# Patient Record
Sex: Female | Born: 1970 | Race: White | Hispanic: No | Marital: Married | Smoking: Never smoker
Health system: Southern US, Community
[De-identification: ages and names within clinical notes are randomized; demographics above are authoritative.]

## PROBLEM LIST (undated history)

## (undated) ENCOUNTER — Emergency Department: Disposition: A | Discharge status: Home / Self Care | Payer: BC Managed Care – PPO

## (undated) DIAGNOSIS — T7840XA Allergy, unspecified, initial encounter: Secondary | ICD-10-CM

## (undated) DIAGNOSIS — C4371 Malignant melanoma of right lower limb, including hip: Secondary | ICD-10-CM

## (undated) HISTORY — DX: Malignant melanoma of right lower limb, including hip: C43.71

## (undated) HISTORY — DX: Allergy, unspecified, initial encounter: T78.40XA

---

## 2001-11-19 ENCOUNTER — Inpatient Hospital Stay (HOSPITAL_COMMUNITY): Admission: AD | Admit: 2001-11-19 | Discharge: 2001-11-19 | Payer: Self-pay | Admitting: Obstetrics and Gynecology

## 2001-12-19 ENCOUNTER — Ambulatory Visit (HOSPITAL_COMMUNITY): Admission: RE | Admit: 2001-12-19 | Discharge: 2001-12-19 | Payer: Self-pay | Admitting: Obstetrics and Gynecology

## 2002-03-04 ENCOUNTER — Inpatient Hospital Stay (HOSPITAL_COMMUNITY): Admission: AD | Admit: 2002-03-04 | Discharge: 2002-03-04 | Payer: Self-pay | Admitting: Obstetrics and Gynecology

## 2002-03-06 ENCOUNTER — Inpatient Hospital Stay (HOSPITAL_COMMUNITY): Admission: AD | Admit: 2002-03-06 | Discharge: 2002-03-08 | Payer: Self-pay | Admitting: Obstetrics and Gynecology

## 2002-04-10 ENCOUNTER — Other Ambulatory Visit: Admission: RE | Admit: 2002-04-10 | Discharge: 2002-04-10 | Payer: Self-pay | Admitting: Obstetrics and Gynecology

## 2003-06-03 ENCOUNTER — Other Ambulatory Visit: Admission: RE | Admit: 2003-06-03 | Discharge: 2003-06-03 | Payer: Self-pay | Admitting: Obstetrics and Gynecology

## 2003-08-04 ENCOUNTER — Emergency Department (HOSPITAL_COMMUNITY): Admission: AD | Admit: 2003-08-04 | Discharge: 2003-08-04 | Payer: Self-pay | Admitting: Family Medicine

## 2004-04-23 ENCOUNTER — Other Ambulatory Visit: Admission: RE | Admit: 2004-04-23 | Discharge: 2004-04-23 | Payer: Self-pay | Admitting: Obstetrics and Gynecology

## 2004-09-01 ENCOUNTER — Encounter: Admission: RE | Admit: 2004-09-01 | Discharge: 2004-09-01 | Payer: Self-pay | Admitting: Obstetrics and Gynecology

## 2005-07-06 ENCOUNTER — Other Ambulatory Visit: Admission: RE | Admit: 2005-07-06 | Discharge: 2005-07-06 | Payer: Self-pay | Admitting: Obstetrics and Gynecology

## 2005-12-27 ENCOUNTER — Emergency Department (HOSPITAL_COMMUNITY): Admission: EM | Admit: 2005-12-27 | Discharge: 2005-12-27 | Payer: Self-pay | Admitting: Emergency Medicine

## 2006-11-24 ENCOUNTER — Inpatient Hospital Stay (HOSPITAL_COMMUNITY): Admission: AD | Admit: 2006-11-24 | Discharge: 2006-11-24 | Payer: Self-pay | Admitting: Obstetrics and Gynecology

## 2007-09-19 ENCOUNTER — Inpatient Hospital Stay (HOSPITAL_COMMUNITY): Admission: AD | Admit: 2007-09-19 | Discharge: 2007-09-22 | Payer: Self-pay | Admitting: Obstetrics and Gynecology

## 2007-10-28 ENCOUNTER — Emergency Department (HOSPITAL_COMMUNITY): Admission: EM | Admit: 2007-10-28 | Discharge: 2007-10-28 | Payer: Self-pay | Admitting: Emergency Medicine

## 2010-01-05 ENCOUNTER — Encounter: Admission: RE | Admit: 2010-01-05 | Discharge: 2010-01-05 | Payer: Self-pay | Admitting: Obstetrics and Gynecology

## 2011-02-03 ENCOUNTER — Other Ambulatory Visit: Payer: Self-pay | Admitting: Obstetrics and Gynecology

## 2011-02-03 DIAGNOSIS — Z1231 Encounter for screening mammogram for malignant neoplasm of breast: Secondary | ICD-10-CM

## 2011-03-03 ENCOUNTER — Ambulatory Visit: Payer: Self-pay

## 2011-03-16 LAB — RH IMMUNE GLOB WKUP(>/=20WKS)(NOT WOMEN'S HOSP)

## 2011-03-16 LAB — CBC
HCT: 31.9 — ABNORMAL LOW
HCT: 32.9 — ABNORMAL LOW
Hemoglobin: 11.1 — ABNORMAL LOW
MCV: 94.4
Platelets: 185
Platelets: 199
RDW: 13.5
WBC: 11.2 — ABNORMAL HIGH

## 2011-04-08 LAB — RH IMMUNE GLOBULIN WORKUP (NOT WOMEN'S HOSP): Antibody Screen: NEGATIVE

## 2011-06-28 IMAGING — MG MM DIGITAL DIAGNOSTIC BILAT
5 series · 5 of 5 positions shown · non-contrast
Comparison: 09/01/2004

CLINICAL DATA: The patient states she had a palpable left breast
mass that has resolved

DIGITAL DIAGNOSTIC BILATERAL MAMMOGRAM WITH CAD AND LEFT BREAST
ULTRASOUND:

[R CC]
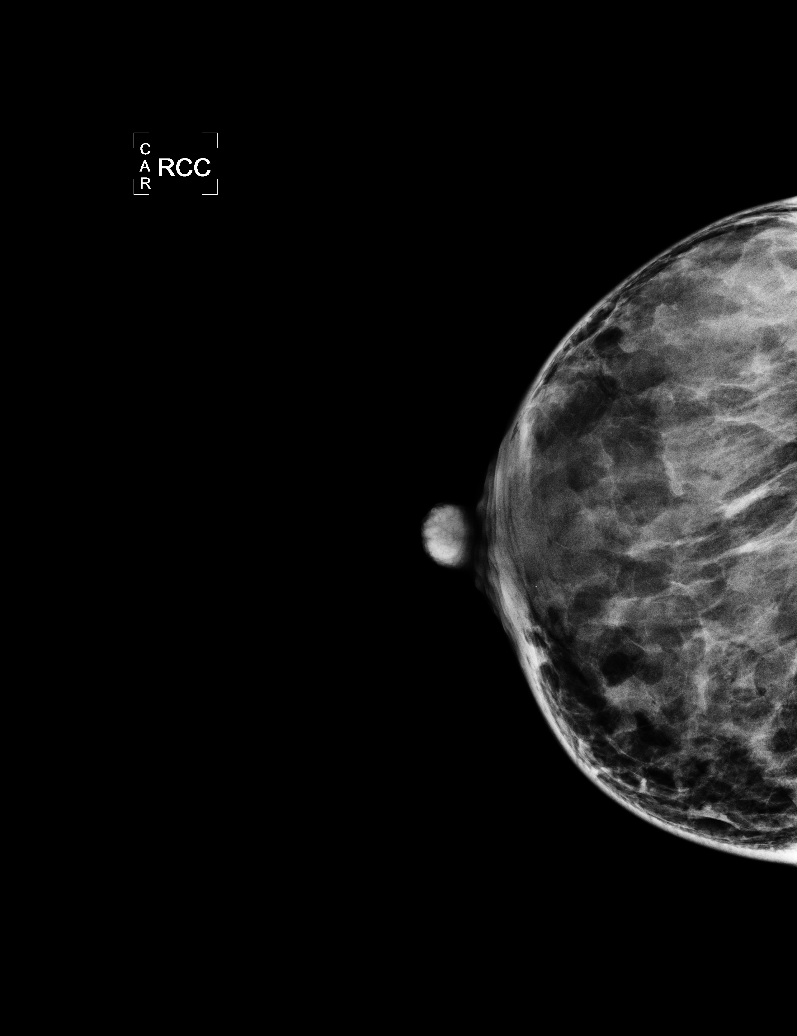

[L CC]
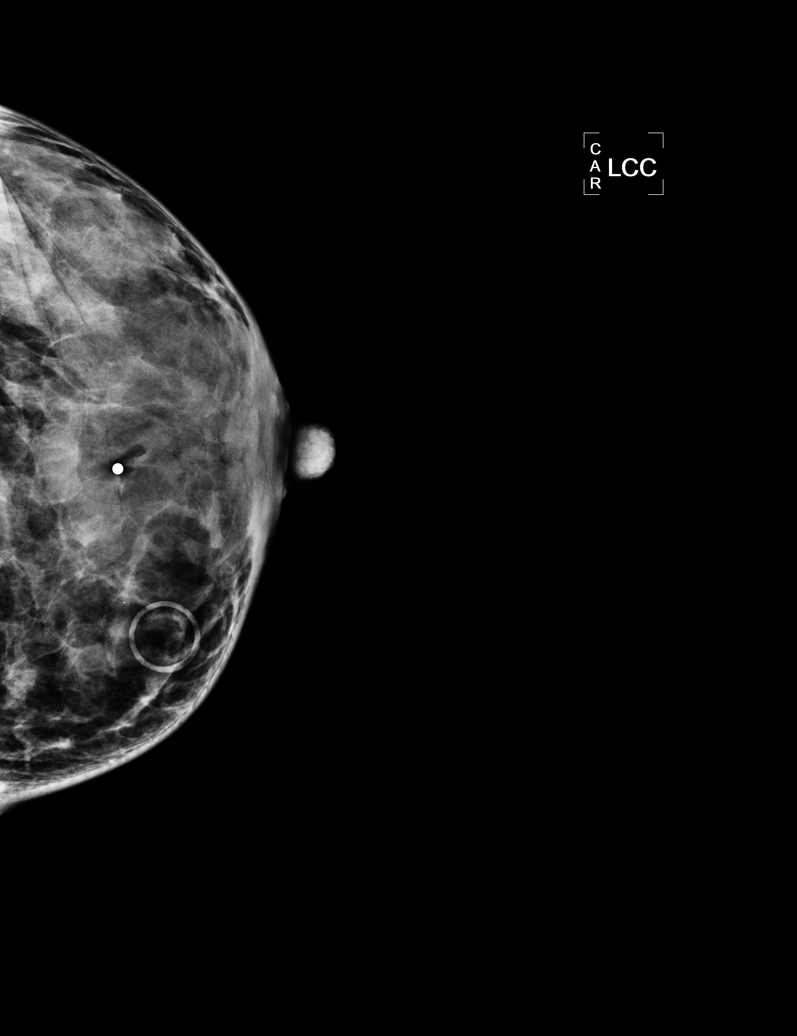

[L MLO]
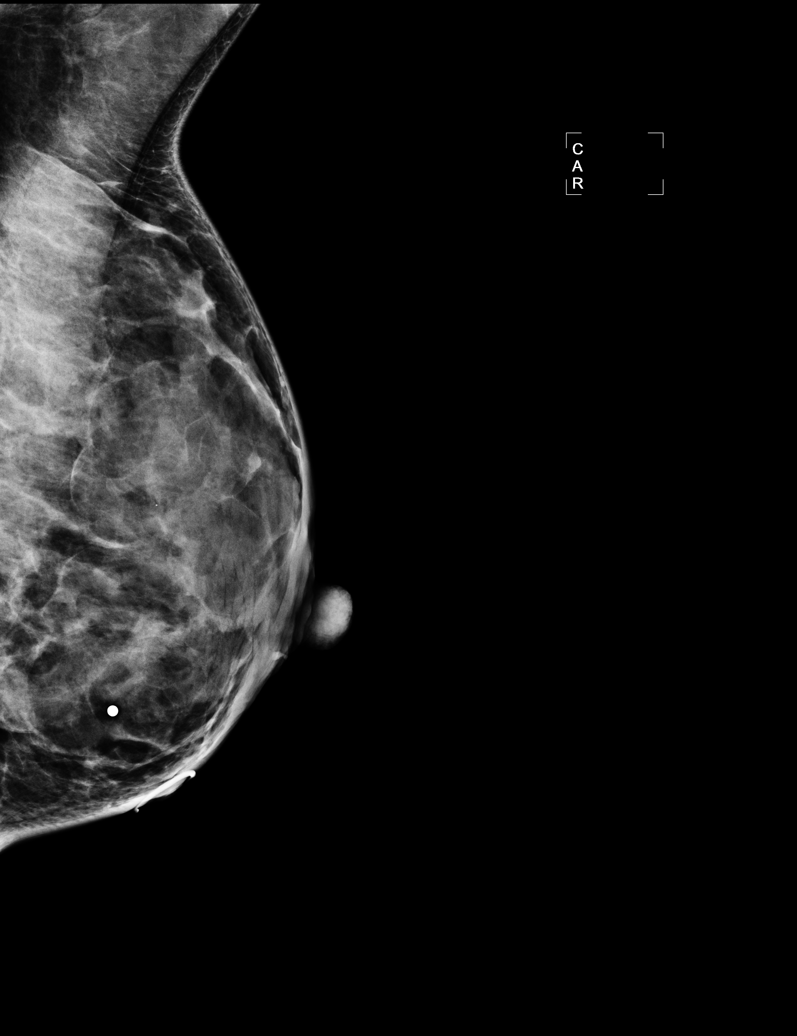

[R MLO]
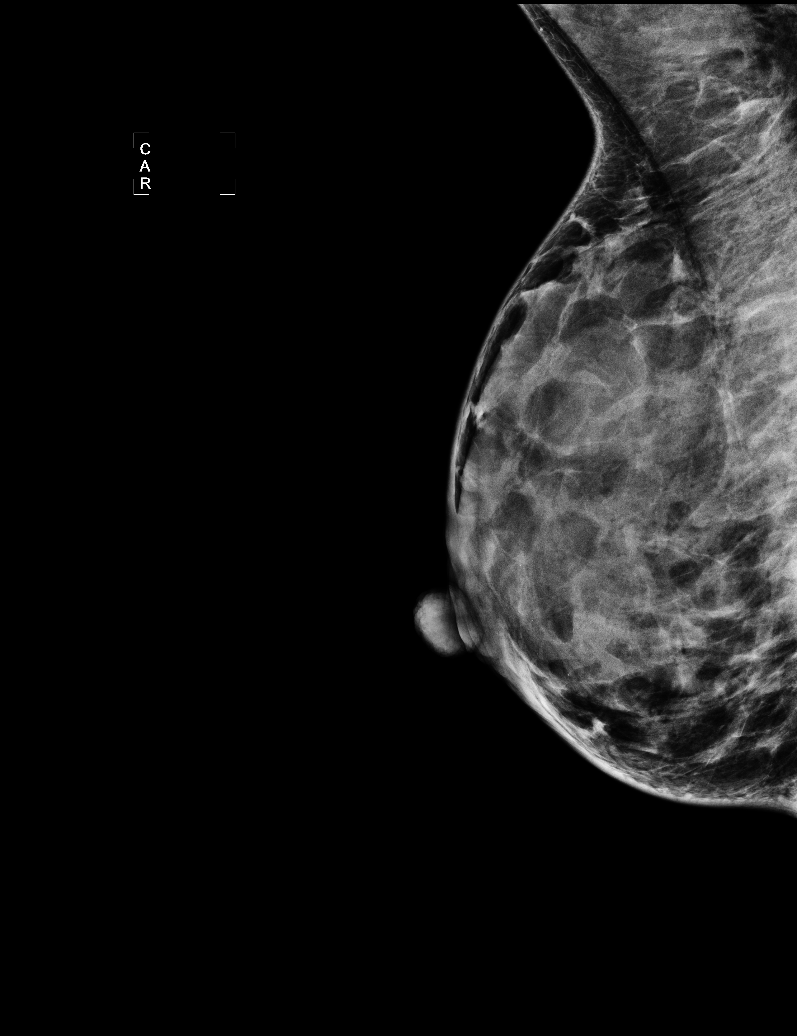

[L TAN]
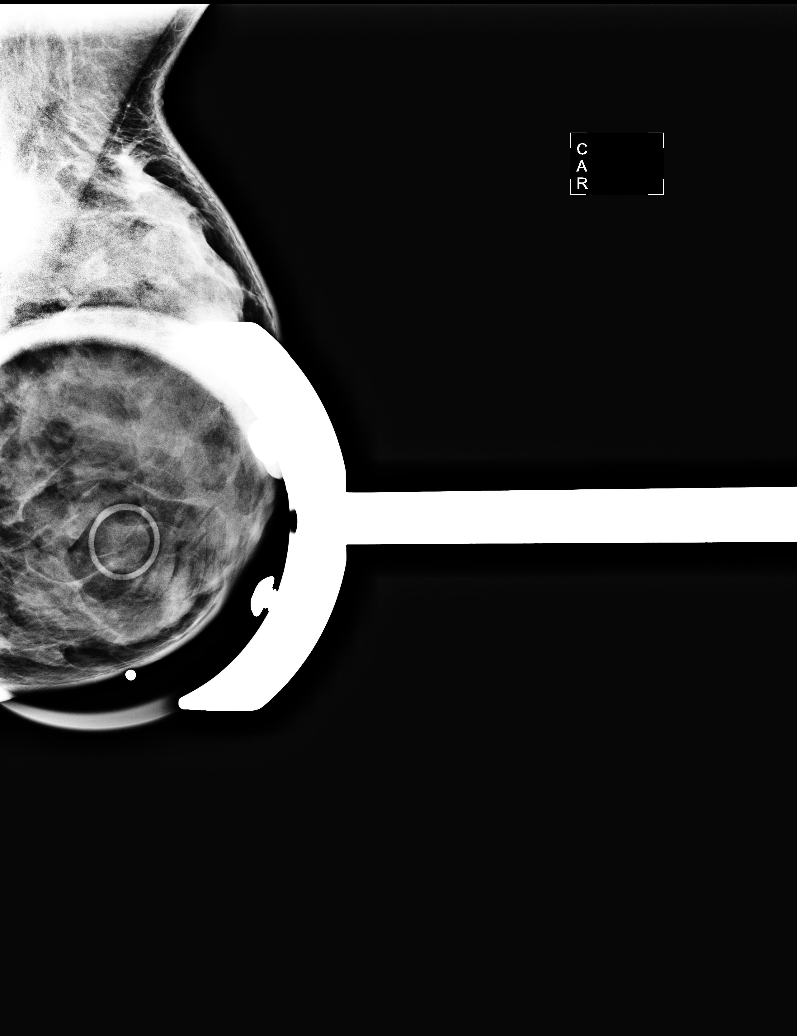

[5 of 5 positions shown; findings below may reference images not displayed]

FINDINGS: There is a dense fibroglandular pattern.  No suspicious
mass or malignant-type microcalcifications identified.
Mammographic images were processed with CAD.

On physical exam, I do not palpate a mass in the left breast.

Ultrasound is performed, showing normal tissue in the inferior
aspect of the left breast.  No solid or cystic mass, abnormal
shadowing or distortion detected.
IMPRESSION: No evidence of malignancy in either breast.  Bilateral screening
mammogram in 1 year is recommended.

BI-RADS CATEGORY 1:  Negative.

## 2011-06-30 ENCOUNTER — Ambulatory Visit
Admission: RE | Admit: 2011-06-30 | Discharge: 2011-06-30 | Disposition: A | Discharge status: Home / Self Care | Payer: Self-pay | Source: Ambulatory Visit | Attending: Obstetrics and Gynecology | Admitting: Obstetrics and Gynecology

## 2011-06-30 DIAGNOSIS — Z1231 Encounter for screening mammogram for malignant neoplasm of breast: Secondary | ICD-10-CM

## 2012-09-07 ENCOUNTER — Other Ambulatory Visit: Payer: Self-pay | Admitting: Obstetrics and Gynecology

## 2012-09-19 ENCOUNTER — Ambulatory Visit
Admission: RE | Admit: 2012-09-19 | Discharge: 2012-09-19 | Disposition: A | Discharge status: Home / Self Care | Payer: BC Managed Care – PPO | Source: Ambulatory Visit | Attending: Obstetrics and Gynecology | Admitting: Obstetrics and Gynecology

## 2012-09-19 DIAGNOSIS — R928 Other abnormal and inconclusive findings on diagnostic imaging of breast: Secondary | ICD-10-CM

## 2014-03-11 IMAGING — MG MM DIAGNOSTIC UNILATERAL L
3 series · 3 of 3 positions shown · non-contrast
Comparison: 09/04/2012 and earlier

CLINICAL DATA: The patient returns after screening study for
evaluation of the left axilla.

DIGITAL DIAGNOSTIC LEFT MAMMOGRAM  AND LEFT BREAST ULTRASOUND:

[L MLO (1 of 2)]
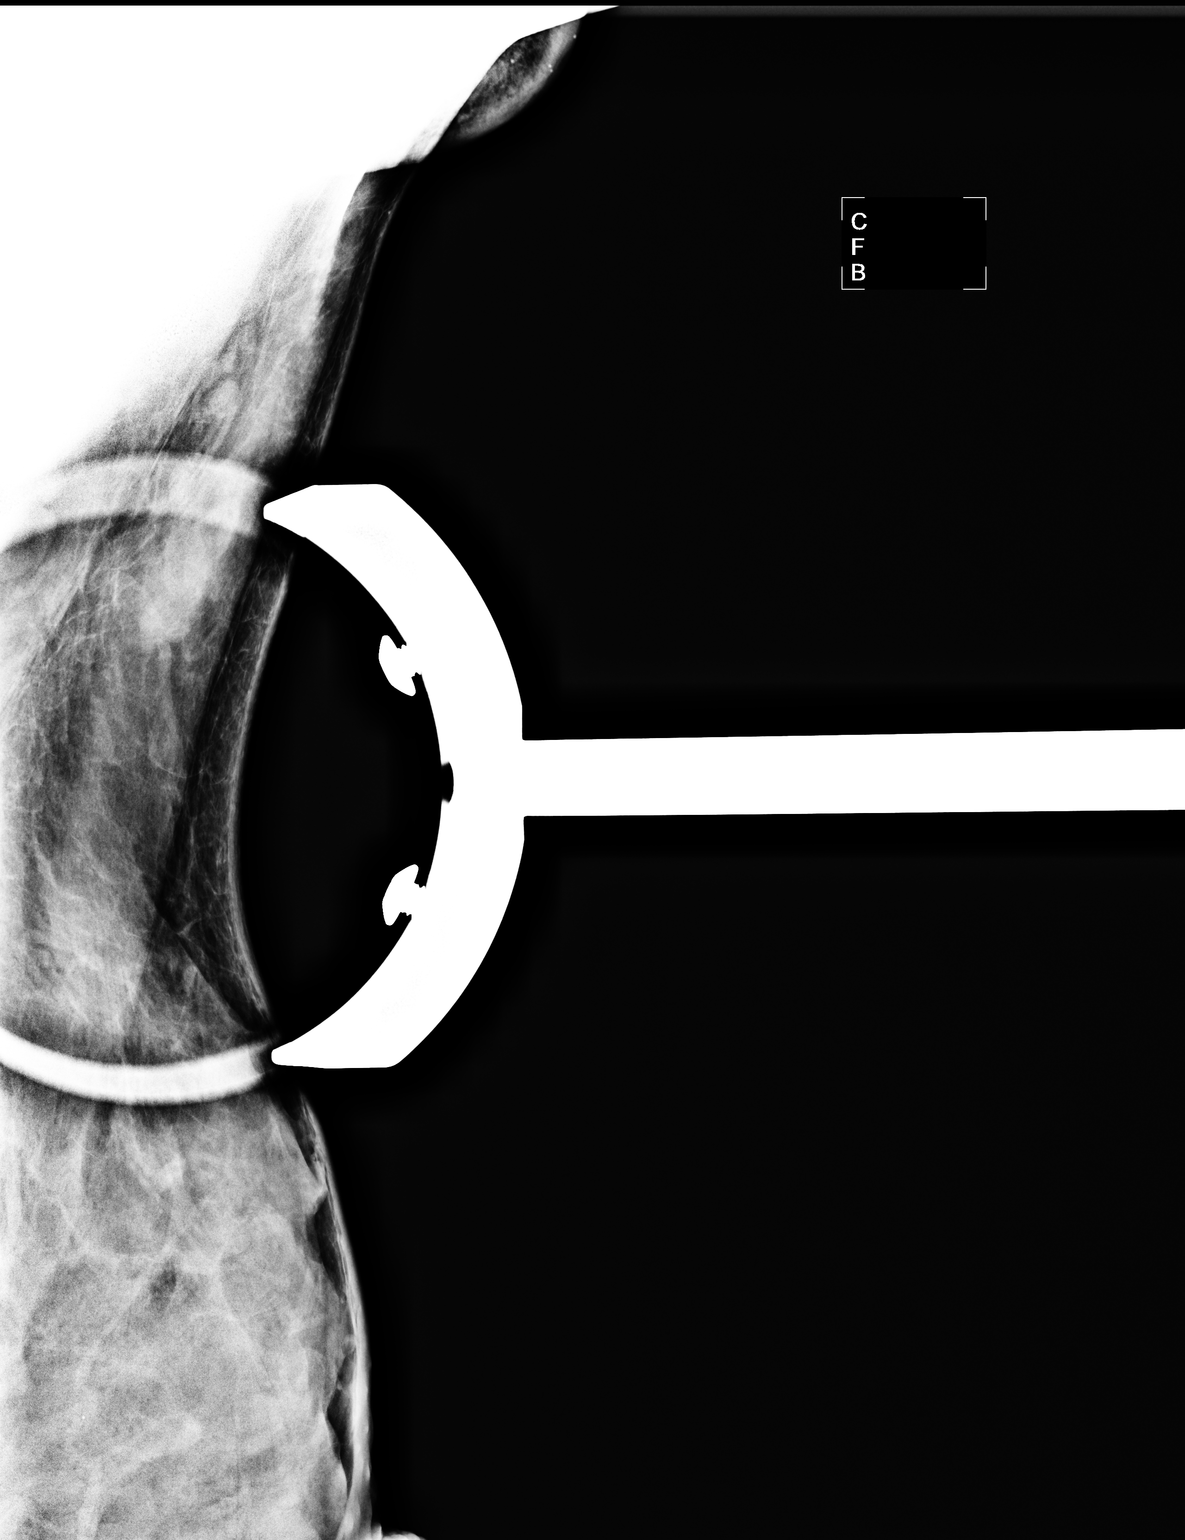

[L MLO (2 of 2)]
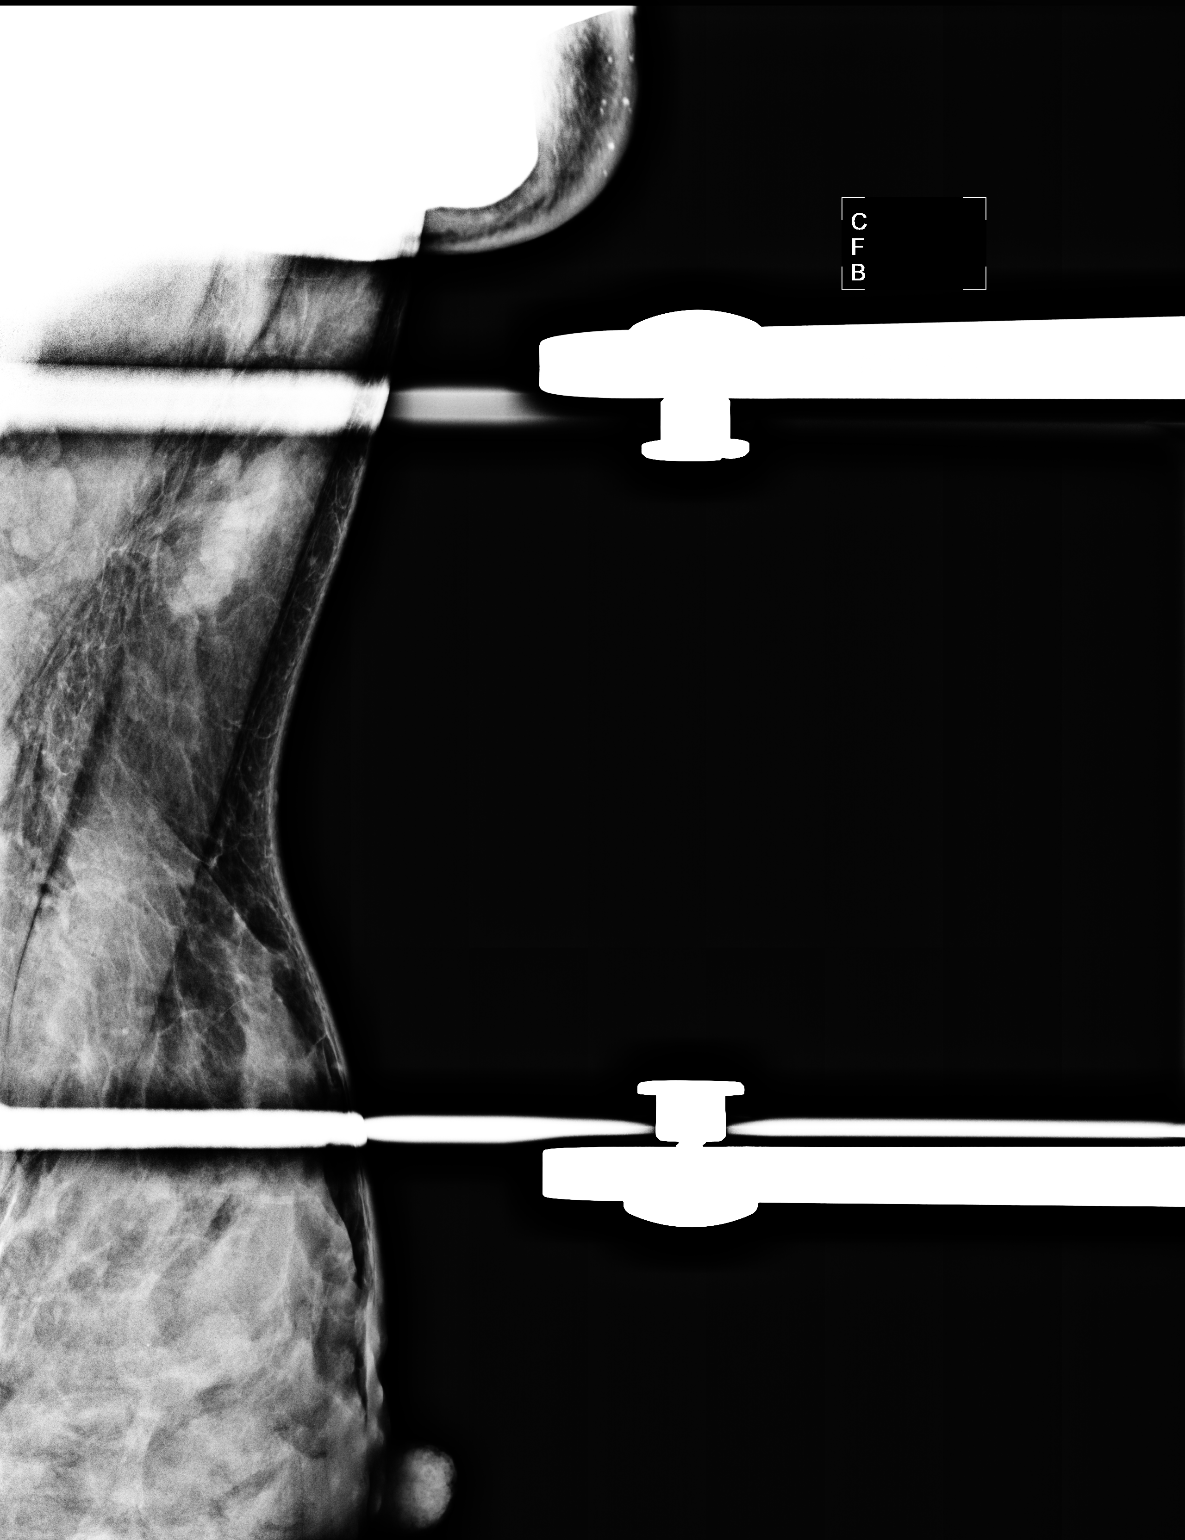

[L XCCL]
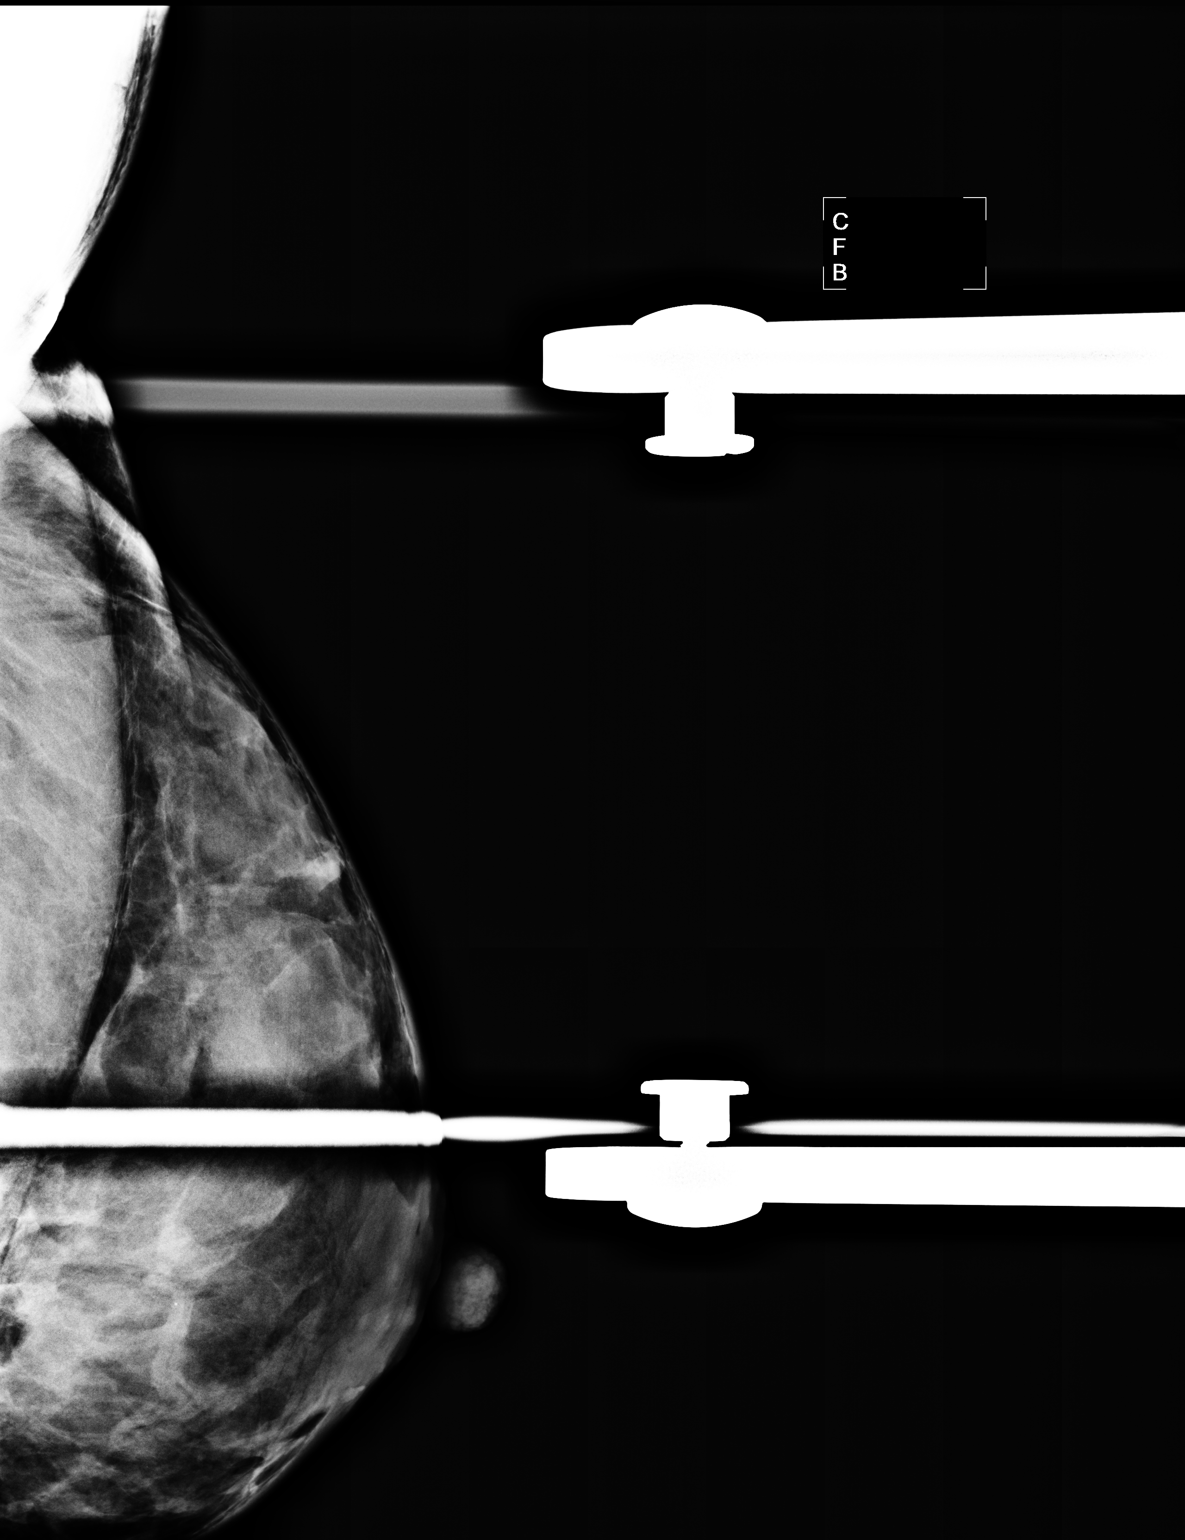

[3 of 3 positions shown; findings below may reference images not displayed]

FINDINGS: ACR Breast Density Category 4: The breast tissue is extremely
dense.

Additional views are performed, confirming presence of left
axillary circumscribed nodules.  These appear low density,
consistent with lymph nodes.

On physical exam, I palpate no axillary mass.  The patient does
note a small, bluish 5 mm protruberant mass lateral to the left
nipple.  This is not tender or erythematous.

Ultrasound is performed, showing benign appearing left axillary
lymph nodes.  The visualized lymph nodes contained fatty hila.  No
suspicious mass identified within the axilla.

Evaluation is also performed of the palpable abnormality in the 4
o'clock location.  In this region, an intradermal cyst is
identified, measuring 5 mm in diameter.  No underlying parenchymal
abnormality identified.
IMPRESSION: 1.  Benign left axillary lymph nodes account for the mammographic
finding.  No adenopathy identified.
2.  Palpable abnormality that the patient has noted represents a
benign intradermal cysts without complicating features.

RECOMMENDATION:

1. Screening mammogram is recommended in one year.
2.  No follow-up is felt to be necessary for the intradermal left
breast cyst.  If this becomes symptomatic, Dermatology referral
could be considered.

I have discussed the findings and recommendations with the patient.
Results were also provided in writing at the conclusion of the
visit.  If applicable, a reminder letter will be sent to the
patient regarding her next appointment.

BI-RADS CATEGORY 2:  Benign finding(s).

## 2018-01-09 ENCOUNTER — Ambulatory Visit: Payer: Self-pay | Admitting: Family Medicine

## 2018-02-13 ENCOUNTER — Other Ambulatory Visit: Payer: Self-pay

## 2018-02-13 ENCOUNTER — Encounter: Payer: Self-pay | Admitting: Family Medicine

## 2018-02-13 ENCOUNTER — Ambulatory Visit (INDEPENDENT_AMBULATORY_CARE_PROVIDER_SITE_OTHER): Payer: Managed Care, Other (non HMO) | Admitting: Family Medicine

## 2018-02-13 VITALS — BP 102/68 | HR 63 | Temp 99.0°F | Resp 16 | Ht 64.76 in | Wt 127.2 lb

## 2018-02-13 DIAGNOSIS — Z23 Encounter for immunization: Secondary | ICD-10-CM | POA: Diagnosis not present

## 2018-02-13 DIAGNOSIS — Z1389 Encounter for screening for other disorder: Secondary | ICD-10-CM

## 2018-02-13 DIAGNOSIS — Z1383 Encounter for screening for respiratory disorder NEC: Secondary | ICD-10-CM

## 2018-02-13 DIAGNOSIS — Z130 Encounter for screening for diseases of the blood and blood-forming organs and certain disorders involving the immune mechanism: Secondary | ICD-10-CM | POA: Diagnosis not present

## 2018-02-13 DIAGNOSIS — Z0000 Encounter for general adult medical examination without abnormal findings: Secondary | ICD-10-CM | POA: Diagnosis not present

## 2018-02-13 DIAGNOSIS — Z1329 Encounter for screening for other suspected endocrine disorder: Secondary | ICD-10-CM

## 2018-02-13 DIAGNOSIS — Z136 Encounter for screening for cardiovascular disorders: Secondary | ICD-10-CM | POA: Diagnosis not present

## 2018-02-13 NOTE — Patient Instructions (Addendum)
If you have lab work done today you will be contacted with your lab results within the next 2 weeks.  If you have not heard from [place] then please contact us. The fastest way to get your results is to register for My Chart.   IF you received an x-ray today, you will receive an invoice from [facility]. Please contact [facility] at [phone] with questions or concerns regarding your invoice.   IF you received labwork today, you will receive an invoice from [place]. Please contact [facility] at [phone] with questions or concerns regarding your invoice.   Our billing staff will not be able to assist you with questions regarding bills from these companies.  You will be contacted with the lab results as soon as they are available. The fastest way to get your results is to activate your My Chart account. Instructions are located on the last page of this paperwork. If you have not heard from [place] regarding the results in 2 weeks, please contact this office.     Health Maintenance, Female Adopting a healthy lifestyle and getting preventive care can go a long way to promote health and wellness. Talk with your health care provider about what schedule of regular examinations is right for you. This is a good chance for you to check in with your provider about disease prevention and staying healthy. In between checkups, there are plenty of things you can do on your own. Experts have done a lot of research about which lifestyle changes and preventive measures are most likely to keep you healthy. Ask your health care provider for more information. Weight and diet Eat a healthy diet  Be sure to include plenty of vegetables, fruits, low-fat dairy products, and lean protein.  Do not eat a lot of foods high in solid fats, added sugars, or salt.  Get regular exercise. This is one of the most important things you can do for your health. ? Most adults should exercise for at least 150  minutes each week. The exercise should increase your heart rate and make you sweat (moderate-intensity exercise). ? Most adults should also do strengthening exercises at least twice a week. This is in addition to the moderate-intensity exercise.  Maintain a healthy weight  Body mass index (BMI) is a measurement that can be used to identify possible weight problems. It estimates body fat based on height and weight. Your health care provider can help determine your BMI and help you achieve or maintain a healthy weight.  For females 22 years of age and older: ? A BMI below 18.5 is considered underweight. ? A BMI of 18.5 to 24.9 is normal. ? A BMI of 25 to 29.9 is considered overweight. ? A BMI of 30 and above is considered obese.  Watch levels of cholesterol and blood lipids  You should start having your blood tested for lipids and cholesterol at 48 years of age, then have this test every 5 years.  You may need to have your cholesterol levels checked more often if: ? Your lipid or cholesterol levels are high. ? You are older than 48 years of age. ? You are at high risk for heart disease.  Cancer screening Lung Cancer  Lung cancer screening is recommended for adults 58-34 years old who are at high risk for lung cancer because of a history of smoking.  A yearly low-dose CT scan of the lungs is recommended for people who: ? Currently smoke. ? Have quit  within the past 15 years. ? Have at least a 30-pack-year history of smoking. A pack year is smoking an average of one pack of cigarettes a day for 1 year.  Yearly screening should continue until it has been 15 years since you quit.  Yearly screening should stop if you develop a health problem that would prevent you from having lung cancer treatment.  Breast Cancer  Practice breast self-awareness. This means understanding how your breasts normally appear and feel.  It also means doing regular breast self-exams. Let your health care  provider know about any changes, no matter how small.  If you are in your 20s or 30s, you should have a clinical breast exam (CBE) by a health care provider every 1-3 years as part of a regular health exam.  If you are 48 or older, have a CBE every year. Also consider having a breast X-ray (mammogram) every year.  If you have a family history of breast cancer, talk to your health care provider about genetic screening.  If you are at high risk for breast cancer, talk to your health care provider about having an MRI and a mammogram every year.  Breast cancer gene (BRCA) assessment is recommended for women who have family members with BRCA-related cancers. BRCA-related cancers include: ? Breast. ? Ovarian. ? Tubal. ? Peritoneal cancers.  Results of the assessment will determine the need for genetic counseling and BRCA1 and BRCA2 testing.  Cervical Cancer Your health care provider may recommend that you be screened regularly for cancer of the pelvic organs (ovaries, uterus, and vagina). This screening involves a pelvic examination, including checking for microscopic changes to the surface of your cervix (Pap test). You may be encouraged to have this screening done every 3 years, beginning at age 8.  For women ages 8-65, health care providers may recommend pelvic exams and Pap testing every 3 years, or they may recommend the Pap and pelvic exam, combined with testing for human papilloma virus (HPV), every 5 years. Some types of HPV increase your risk of cervical cancer. Testing for HPV may also be done on women of any age with unclear Pap test results.  Other health care providers may not recommend any screening for nonpregnant women who are considered low risk for pelvic cancer and who do not have symptoms. Ask your health care provider if a screening pelvic exam is right for you.  If you have had past treatment for cervical cancer or a condition that could lead to cancer, you need Pap tests  and screening for cancer for at least 20 years after your treatment. If Pap tests have been discontinued, your risk factors (such as having a new sexual partner) need to be reassessed to determine if screening should resume. Some women have medical problems that increase the chance of getting cervical cancer. In these cases, your health care provider may recommend more frequent screening and Pap tests.  Colorectal Cancer  This type of cancer can be detected and often prevented.  Routine colorectal cancer screening usually begins at 48 years of age and continues through 48 years of age.  Your health care provider may recommend screening at an earlier age if you have risk factors for colon cancer.  Your health care provider may also recommend using home test kits to check for hidden blood in the stool.  A small camera at the end of a tube can be used to examine your colon directly (sigmoidoscopy or colonoscopy). This is done to check  for the earliest forms of colorectal cancer.  Routine screening usually begins at age 75.  Direct examination of the colon should be repeated every 5-10 years through 48 years of age. However, you may need to be screened more often if early forms of precancerous polyps or small growths are found.  Skin Cancer  Check your skin from head to toe regularly.  Tell your health care provider about any new moles or changes in moles, especially if there is a change in a mole's shape or color.  Also tell your health care provider if you have a mole that is larger than the size of a pencil eraser.  Always use sunscreen. Apply sunscreen liberally and repeatedly throughout the day.  Protect yourself by wearing long sleeves, pants, a wide-brimmed hat, and sunglasses whenever you are outside.  Heart disease, diabetes, and high blood pressure  High blood pressure causes heart disease and increases the risk of stroke. High blood pressure is more likely to develop  in: ? People who have blood pressure in the high end of the normal range (130-139/85-89 mm Hg). ? People who are overweight or obese. ? People who are African American.  If you are 41-67 years of age, have your blood pressure checked every 3-5 years. If you are 32 years of age or older, have your blood pressure checked every year. You should have your blood pressure measured twice-once when you are at a hospital or clinic, and once when you are not at a hospital or clinic. Record the average of the two measurements. To check your blood pressure when you are not at a hospital or clinic, you can use: ? An automated blood pressure machine at a pharmacy. ? A home blood pressure monitor.  If you are between 44 years and 70 years old, ask your health care provider if you should take aspirin to prevent strokes.  Have regular diabetes screenings. This involves taking a blood sample to check your fasting blood sugar level. ? If you are at a normal weight and have a low risk for diabetes, have this test once every three years after 48 years of age. ? If you are overweight and have a high risk for diabetes, consider being tested at a younger age or more often. Preventing infection Hepatitis B  If you have a higher risk for hepatitis B, you should be screened for this virus. You are considered at high risk for hepatitis B if: ? You were born in a country where hepatitis B is common. Ask your health care provider which countries are considered high risk. ? Your parents were born in a high-risk country, and you have not been immunized against hepatitis B (hepatitis B vaccine). ? You have HIV or AIDS. ? You use needles to inject street drugs. ? You live with someone who has hepatitis B. ? You have had sex with someone who has hepatitis B. ? You get hemodialysis treatment. ? You take certain medicines for conditions, including cancer, organ transplantation, and autoimmune conditions.  Hepatitis C  Blood  testing is recommended for: ? Everyone born from [birth year] through 1966. ? Anyone with known risk factors for hepatitis C.  Sexually transmitted infections (STIs)  You should be screened for sexually transmitted infections (STIs) including gonorrhea and chlamydia if: ? You are sexually active and are younger than 48 years of age. ? You are older than 48 years of age and your health care provider tells you that you are at risk for  this type of infection. ? Your sexual activity has changed since you were last screened and you are at an increased risk for chlamydia or gonorrhea. Ask your health care provider if you are at risk.  If you do not have HIV, but are at risk, it may be recommended that you take a prescription medicine daily to prevent HIV infection. This is called pre-exposure prophylaxis (PrEP). You are considered at risk if: ? You are sexually active and do not regularly use condoms or know the HIV status of your partner(s). ? You take drugs by injection. ? You are sexually active with a partner who has HIV.  Talk with your health care provider about whether you are at high risk of being infected with HIV. If you choose to begin PrEP, you should first be tested for HIV. You should then be tested every 3 months for as long as you are taking PrEP. Pregnancy  If you are premenopausal and you may become pregnant, ask your health care provider about preconception counseling.  If you may become pregnant, take 400 to 800 micrograms (mcg) of folic acid every day.  If you want to prevent pregnancy, talk to your health care provider about birth control (contraception). Osteoporosis and menopause  Osteoporosis is a disease in which the bones lose minerals and strength with aging. This can result in serious bone fractures. Your risk for osteoporosis can be identified using a bone density scan.  If you are 68 years of age or older, or if you are at risk for osteoporosis and fractures, ask your  health care provider if you should be screened.  Ask your health care provider whether you should take a calcium or vitamin D supplement to lower your risk for osteoporosis.  Menopause may have certain physical symptoms and risks.  Hormone replacement therapy may reduce some of these symptoms and risks. Talk to your health care provider about whether hormone replacement therapy is right for you. Follow these instructions at home:  Schedule regular health, dental, and eye exams.  Stay current with your immunizations.  Do not use any tobacco products including cigarettes, chewing tobacco, or electronic cigarettes.  If you are pregnant, do not drink alcohol.  If you are breastfeeding, limit how much and how often you drink alcohol.  Limit alcohol intake to no more than 1 drink per day for nonpregnant women. One drink equals 12 ounces of beer, 5 ounces of wine, or 1 ounces of hard liquor.  Do not use street drugs.  Do not share needles.  Ask your health care provider for help if you need support or information about quitting drugs.  Tell your health care provider if you often feel depressed.  Tell your health care provider if you have ever been abused or do not feel safe at home. This information is not intended to replace advice given to you by your health care provider. Make sure you discuss any questions you have with your health care provider. Document Released: 12/21/2010 Document Revised: 11/13/2015 Document Reviewed: 03/11/2015 Elsevier Interactive Patient Education  [employer].

## 2018-02-13 NOTE — Progress Notes (Signed)
Subjective:    Patient ID: Destiny Welch, female    DOB: [birth year], 47 y.o.   MRN: [id number] Chief Complaint  Patient presents with  . 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  Mammogram clear  HAs  Due to stress and hormonal  - massage once a month but excedrin than ever. Eye exam 2 years.  10 years  Diet  Exercise 6 out 7 run, cross-fit - since college - local races 3 age 37 boys, 23 boys, 75 girl Joy helped raised them Past Medical History:  Diagnosis Date  . Allergy   . 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 No pertinent surgical history. Current Outpatient Medications on File Prior to Visit  Medication Sig Dispense Refill  . acetaminophen (TYLENOL) 325 MG tablet Take 650 mg by mouth every 6 (six) hours as needed.    Marland Kitchen aspirin-acetaminophen-caffeine (EXCEDRIN MIGRAINE) 250-250-65 MG tablet Take by mouth every 6 (six) hours as needed for headache.     No current facility-administered medications on file prior to visit.    No Known Allergies History reviewed. No pertinent family history. Social History   Socioeconomic History  . Marital status: Married    Spouse name: Not on file  . Number of children: Not on file  . Years of education: Not on file  . Highest education level: Not on file  Occupational History  . Not on file  Social Needs  . Financial resource strain: Not on file  . Food insecurity:    Worry: Not on file    Inability: Not on file  . Transportation needs:    Medical: Not on file    Non-medical: Not on file  Tobacco Use  . Smoking status: Never Smoker  . Smokeless tobacco: Never Used  Substance and Sexual Activity  . Alcohol use: Not on file  . Drug use: Not on file  . Sexual activity: Not on file  Lifestyle  . Physical activity:    Days per week: Not on file    Minutes per session: Not on file  . Stress: Not on file  Relationships  . Social  connections:    Talks on phone: Not on file    Gets together: Not on file    Attends religious service: Not on file    Active member of club or organization: Not on file    Attends meetings of clubs or organizations: Not on file    Relationship status: Not on file  Other Topics Concern  . Not on file  Social History Narrative  . Not on file   Depression screen [facility] 2/9 02/13/2018  Decreased Interest 0  Down, Depressed, Hopeless 0  PHQ - 2 Score 0  Review of Systems  All other systems reviewed and are negative.  See hpi    Objective:   Physical Exam  Constitutional: She is oriented to person, place, and time. She appears well-developed and well-nourished. No distress.  HENT:  Head: Normocephalic and atraumatic.  Right Ear: Tympanic membrane, external ear and ear canal normal.  Left Ear: Tympanic membrane, external ear and ear canal normal.  Nose: Nose normal. No mucosal edema or rhinorrhea.  Mouth/Throat: Uvula is midline, oropharynx is clear and moist and mucous membranes are normal. No posterior oropharyngeal erythema.  Eyes: Pupils are equal, round, and reactive to light. Conjunctivae and EOM are normal. Right eye exhibits  no discharge. Left eye exhibits no discharge. No scleral icterus.  Neck: Normal range of motion. Neck supple. No thyromegaly present.  Cardiovascular: Normal rate, regular rhythm, normal heart sounds and intact distal pulses.  Pulmonary/Chest: Effort normal and breath sounds normal. No respiratory distress.  Abdominal: Soft. Bowel sounds are normal. There is no tenderness.  Musculoskeletal: She exhibits no edema.  Lymphadenopathy:    She has no cervical adenopathy.  Neurological: She is alert and oriented to person, place, and time. She has normal reflexes.  Skin: Skin is warm and dry. She is not diaphoretic. No erythema.  Psychiatric: She has a normal mood and affect. Her behavior is normal.     BP 102/68   Pulse 63   Temp 99 F (37.2 C) (Oral)   Resp  16   Ht 5' 4.76" (1.645 m)   Wt 127 lb 3.2 oz (57.7 kg)   LMP  (LMP Unknown)   SpO2 99%   BMI 21.32 kg/m   Assessment & Plan:   1. Annual physical exam   2. Need for prophylactic vaccination and inoculation against influenza   3. Screening for cardiovascular, respiratory, and genitourinary diseases   4. Screening for deficiency anemia   5. Screening for thyroid disorder     Orders Placed This Encounter  Procedures  . Flu Vaccine QUAD 36+ mos IM  . CBC with Differential/Platelet    Standing Status:   Future    Standing Expiration Date:   02/14/2019  . Comprehensive metabolic panel    Standing Status:   Future    Standing Expiration Date:   02/14/2019    Order Specific Question:   Has the patient fasted?    Answer:   Yes  . Lipid panel    Standing Status:   Future    Standing Expiration Date:   02/14/2019    Order Specific Question:   Has the patient fasted?    Answer:   Yes  . TSH    Standing Status:   Future    Standing Expiration Date:   02/14/2019    Delman Cheadle, MD, MPH [facility] [address] [place], [place]  [id number] [phone] Office phone  [phone] Office fax   04/10/18 1:05 AM

## 2018-08-16 ENCOUNTER — Ambulatory Visit: Payer: Self-pay | Admitting: Nurse Practitioner

## 2018-08-16 VITALS — BP 100/72 | HR 64 | Temp 99.1°F | Resp 14 | Wt 130.8 lb

## 2018-08-16 DIAGNOSIS — R6889 Other general symptoms and signs: Secondary | ICD-10-CM

## 2018-08-16 LAB — POCT INFLUENZA A/B
INFLUENZA B, POC: NEGATIVE
Influenza A, POC: NEGATIVE

## 2018-08-16 MED ORDER — PROMETHAZINE-DM 6.25-15 MG/5ML PO SYRP: 5.0000 mL | ORAL_SOLUTION | Freq: Four times a day (QID) | ORAL | 0 refills | Status: AC | PRN

## 2018-08-16 MED ORDER — OSELTAMIVIR PHOSPHATE 75 MG PO CAPS: 75.0000 mg | ORAL_CAPSULE | Freq: Two times a day (BID) | ORAL | 0 refills | Status: AC

## 2018-08-16 NOTE — Patient Instructions (Signed)
Influenza-Like Symptoms, Adult  -Take medication as prescribed. -Ibuprofen 600-800mg  for throat pain, fever or discomfort.   -Get plenty of rest. -Increase fluids. -Sleep elevated on at least 2 pillows at bedtime to help with cough. -Use a humidifier or vaporizer when at home and during sleep to help with cough. -May use a teaspoon of honey or over-the-counter cough drops to help with cough. -Warm saltwater gargles 3-4 times daily until symptoms improve. -Follow-up if symptoms do not improve.   Influenza, more commonly known as "the flu," is a viral infection that mainly affects the respiratory tract. The respiratory tract includes organs that help you breathe, such as the lungs, nose, and throat. The flu causes many symptoms similar to the common cold along with high fever and body aches. The flu spreads easily from person to person (is contagious). Getting a flu shot (influenza vaccination) every year is the best way to prevent the flu. What are the causes? This condition is caused by the influenza virus. You can get the virus by:  Breathing in droplets that are in the air from an infected person's cough or sneeze.  Touching something that has been exposed to the virus (has been contaminated) and then touching your mouth, nose, or eyes. What increases the risk? The following factors may make you more likely to get the flu:  Not washing or sanitizing your hands often.  Having close contact with many people during cold and flu season.  Touching your mouth, eyes, or nose without first washing or sanitizing your hands.  Not getting a yearly (annual) flu shot. You may have a higher risk for the flu, including serious problems such as a lung infection (pneumonia), if you:  Are older than 65.  Are pregnant.  Have a weakened disease-fighting system (immune system). You may have a weakened immune system if you: ? Have HIV or AIDS. ? Are undergoing chemotherapy. ? Are taking medicines  that reduce (suppress) the activity of your immune system.  Have a long-term (chronic) illness, such as heart disease, kidney disease, diabetes, or lung disease.  Have a liver disorder.  Are severely overweight (morbidly obese).  Have anemia. This is a condition that affects your red blood cells.  Have asthma. What are the signs or symptoms? Symptoms of this condition usually begin suddenly and last 4-14 days. They may include:  Fever and chills.  Headaches, body aches, or muscle aches.  Sore throat.  Cough.  Runny or stuffy (congested) nose.  Chest discomfort.  Poor appetite.  Weakness or fatigue.  Dizziness.  Nausea or vomiting. How is this diagnosed? This condition may be diagnosed based on:  Your symptoms and medical history.  A physical exam.  Swabbing your nose or throat and testing the fluid for the influenza virus. How is this treated? If the flu is diagnosed early, you can be treated with medicine that can help reduce how severe the illness is and how long it lasts (antiviral medicine). This may be given by mouth (orally) or through an IV. Taking care of yourself at home can help relieve symptoms. Your health care provider may recommend:  Taking over-the-counter medicines.  Drinking plenty of fluids. In many cases, the flu goes away on its own. If you have severe symptoms or complications, you may be treated in a hospital. Follow these instructions at home: Activity  Rest as needed and get plenty of sleep.  Stay home from work or school as told by your health care provider. Unless you are  visiting your health care provider, avoid leaving home until your fever has been gone for 24 hours without taking medicine. Eating and drinking  Take an oral rehydration solution (ORS). This is a drink that is sold at pharmacies and retail stores.  Drink enough fluid to keep your urine pale yellow.  Drink clear fluids in small amounts as you are able. Clear  fluids include water, ice chips, diluted fruit juice, and low-calorie sports drinks.  Eat bland, easy-to-digest foods in small amounts as you are able. These foods include bananas, applesauce, rice, lean meats, toast, and crackers.  Avoid drinking fluids that contain a lot of sugar or caffeine, such as energy drinks, regular sports drinks, and soda.  Avoid alcohol.  Avoid spicy or fatty foods. General instructions      Take over-the-counter and prescription medicines only as told by your health care provider.  Use a cool mist humidifier to add humidity to the air in your home. This can make it easier to breathe.  Cover your mouth and nose when you cough or sneeze.  Wash your hands with soap and water often, especially after you cough or sneeze. If soap and water are not available, use alcohol-based hand sanitizer.  Keep all follow-up visits as told by your health care provider. This is important. How is this prevented?   Get an annual flu shot. You may get the flu shot in late summer, fall, or winter. Ask your health care provider when you should get your flu shot.  Avoid contact with people who are sick during cold and flu season. This is generally fall and winter. Contact a health care provider if:  You develop new symptoms.  You have: ? Chest pain. ? Diarrhea. ? A fever.  Your cough gets worse.  You produce more mucus.  You feel nauseous or you vomit. Get help right away if:  You develop shortness of breath or difficulty breathing.  Your skin or nails turn a bluish color.  You have severe pain or stiffness in your neck.  You develop a sudden headache or sudden pain in your face or ear.  You cannot eat or drink without vomiting. Summary  Influenza, more commonly known as "the flu," is a viral infection that primarily affects your respiratory tract.  Symptoms of the flu usually begin suddenly and last 4-14 days.  Getting an annual flu shot is the best way  to prevent getting the flu.  Stay home from work or school as told by your health care provider. Unless you are visiting your health care provider, avoid leaving home until your fever has been gone for 24 hours without taking medicine.  Keep all follow-up visits as told by your health care provider. This is important. This information is not intended to replace advice given to you by your health care provider. Make sure you discuss any questions you have with your health care provider. Document Released: 06/04/2000 Document Revised: 11/23/2017 Document Reviewed: 11/23/2017 Elsevier Interactive Patient Education  2019 [employer].

## 2018-08-16 NOTE — Progress Notes (Signed)
Subjective:     Destiny Welch is a 48 y.o. female who presents for evaluation of influenza like symptoms. Symptoms include suspected fevers but not measured at home, chills, dry cough, headache, myalgias, post nasal drip, sore throat, swollen glands and fever and have been present for 1 day.The patient denies ear pain, ear drainage, productive cough, drooling, hot potato voice, abdominal pain, nausea or vomiting.  Patient informs her daughter was sick with what she possibly felt was the flu that lasted for 5 days.  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  Patient is requesting an influenza test for confirmation. .She has tried to alleviate the symptoms with Dayquil with minimal relief. High risk factors for influenza complications: none.  Patient informs she did receive the influenza vaccine this season.  The following portions of the patient's history were reviewed and updated as appropriate: allergies, current medications and past medical history.  Review of Systems Constitutional: positive for chills, fatigue, fevers and malaise, negative for anorexia and sweats Eyes: negative Ears, nose, mouth, throat, and face: positive for nasal congestion and sore throat, negative for ear drainage, earaches and hoarseness Respiratory: positive for cough, negative for asthma, chronic bronchitis, dyspnea on exertion, sputum, stridor and wheezing Cardiovascular: negative Gastrointestinal: negative Neurological: positive for headaches, negative for coordination problems, dizziness, paresthesia, vertigo and weakness     Objective:    BP 100/72   Pulse 64   Temp 99.1 F (37.3 C)   Resp 14   Wt 130 lb 12.8 oz (59.3 kg)   SpO2 98%   BMI 21.93 kg/m   Physical Exam Vitals signs reviewed.  Constitutional:      General: She is not in acute distress. HENT:     Head: Normocephalic.     Right Ear: Tympanic membrane, ear canal and external ear normal.     Left Ear:  Tympanic membrane, ear canal and external ear normal.     Nose: Mucosal edema, congestion (mild) and rhinorrhea (clear drainage) present.     Right Turbinates: Enlarged and swollen.     Left Turbinates: Enlarged and swollen.     Right Sinus: No maxillary sinus tenderness or frontal sinus tenderness.     Left Sinus: No maxillary sinus tenderness or frontal sinus tenderness.     Mouth/Throat:     Lips: Pink.     Mouth: Mucous membranes are moist.     Pharynx: Uvula midline. Pharyngeal swelling and posterior oropharyngeal erythema present. No oropharyngeal exudate.     Tonsils: No tonsillar exudate. Swelling: 1+ on the right. 1+ on the left.  Eyes:     Pupils: Pupils are equal, round, and reactive to light.  Neck:     Musculoskeletal: Normal range of motion.  Cardiovascular:     Rate and Rhythm: Normal rate and regular rhythm.     Pulses: Normal pulses.     Heart sounds: Normal heart sounds.  Pulmonary:     Effort: Pulmonary effort is normal. No respiratory distress.     Breath sounds: Normal breath sounds. No wheezing or rales.  Abdominal:     General: Abdomen is flat. Bowel sounds are normal.     Tenderness: There is no abdominal tenderness.  Lymphadenopathy:     Cervical: Cervical adenopathy (bilateral superficial) present.  Skin:    General: Skin is warm and dry.     Capillary Refill: Capillary refill takes less than 2 seconds.  Neurological:     General: No  focal deficit present.     Mental Status: She is alert.     Cranial Nerves: No cranial nerve deficit.  Psychiatric:        Mood and Affect: Mood normal.        Thought Content: Thought content normal.    Results for orders placed or performed in visit on 08/16/18 (from the past 24 hour(s))  POCT Influenza A/B     Status: None   Collection Time: 08/16/18  6:25 PM  Result Value Ref Range   Influenza A, POC Negative Negative   Influenza B, POC Negative Negative   Assessment:    Influenza like syndrome    Plan:    Exam findings, diagnosis etiology and medication use and indications reviewed with patient. Follow- Up and discharge instructions provided. No emergent/urgent issues found on exam. Based on the patient's clinical presentation, symptoms and physical exam, patient's findings were consistent with that of influenza; however, patient requested an influenza test for confirmation.  Influenza test was negative. Felt that patient's symptoms met criteria for a diagnosis of influenza-like symptoms.  Patient opted to use Tamiflu for treatment as well as discussing the option of continuing symptomatic treatment only as she has no indications of being high-risk. The patient was given a prescription for promethazine-DM for cough.  Patient was instructed to continue additional symptomatic treatment to include anti-pyretics, fluids and rest.  Patient education was provided. Patient verbalized understanding of information provided and agrees with plan of care (POC), all questions answered. The patient is advised to call or return to clinic if condition does not see an improvement in symptoms, or to seek the care of the closest emergency department if condition worsens with the above plan.    1. Flu-like symptoms  - POCT Influenza A/B  2. Influenza-like symptoms  - oseltamivir (TAMIFLU) 75 MG capsule; Take 1 capsule (75 mg total) by mouth 2 (two) times daily for 5 days.  Dispense: 10 capsule; Refill: 0 - promethazine-dextromethorphan (PROMETHAZINE-DM) 6.25-15 MG/5ML syrup; Take 5 mLs by mouth 4 (four) times daily as needed for up to 7 days.  Dispense: 140 mL; Refill: 0 -Take medication as prescribed. -Ibuprofen 600-853m for throat pain, fever or discomfort.   -Get plenty of rest. -Increase fluids. -Sleep elevated on at least 2 pillows at bedtime to help with cough. -Use a humidifier or vaporizer when at home and during sleep to help with cough. -May use a teaspoon of honey or over-the-counter cough drops to help  with cough. -Warm saltwater gargles 3-4 times daily until symptoms improve. -Follow-up if symptoms do not improve.

## 2018-08-18 ENCOUNTER — Telehealth: Payer: Self-pay

## 2018-08-18 NOTE — Telephone Encounter (Signed)
Patient did not answer the phone.

## 2019-04-18 ENCOUNTER — Other Ambulatory Visit: Payer: Self-pay

## 2019-04-18 ENCOUNTER — Ambulatory Visit: Payer: Self-pay

## 2019-04-18 DIAGNOSIS — Z23 Encounter for immunization: Secondary | ICD-10-CM

## 2020-07-08 ENCOUNTER — Other Ambulatory Visit: Payer: Self-pay

## 2022-02-09 ENCOUNTER — Other Ambulatory Visit: Payer: Self-pay

## 2022-02-09 ENCOUNTER — Encounter (HOSPITAL_BASED_OUTPATIENT_CLINIC_OR_DEPARTMENT_OTHER): Payer: Self-pay | Admitting: Obstetrics and Gynecology

## 2022-02-09 ENCOUNTER — Emergency Department (HOSPITAL_BASED_OUTPATIENT_CLINIC_OR_DEPARTMENT_OTHER)
Admission: EM | Admit: 2022-02-09 | Discharge: 2022-02-09 | Disposition: A | Discharge status: Home / Self Care | Payer: BC Managed Care – PPO | Attending: Emergency Medicine | Admitting: Emergency Medicine

## 2022-02-09 DIAGNOSIS — Z85820 Personal history of malignant melanoma of skin: Secondary | ICD-10-CM | POA: Insufficient documentation

## 2022-02-09 DIAGNOSIS — M7918 Myalgia, other site: Secondary | ICD-10-CM | POA: Insufficient documentation

## 2022-02-09 DIAGNOSIS — J029 Acute pharyngitis, unspecified: Secondary | ICD-10-CM | POA: Diagnosis present

## 2022-02-09 LAB — GROUP A STREP BY PCR: Group A Strep by PCR: NOT DETECTED

## 2022-02-09 MED ORDER — DEXAMETHASONE 4 MG PO TABS
8.0000 mg | ORAL_TABLET | Freq: Once | ORAL | Status: AC
  Administered 2022-02-09: 8 mg via ORAL
  Filled 2022-02-09: qty 2

## 2022-02-09 NOTE — Discharge Instructions (Signed)
To COVID test at home.  Your strep test was negative.  Motrin Tylenol may help with the pain.  The steroids also should help you feel better faster.

## 2022-02-09 NOTE — ED Provider Notes (Signed)
  [facility] Provider Note   CSN: [id number] Arrival date & time: 02/09/22  2409     History  Chief Complaint  Patient presents with   Sore Throat    Destiny Welch is a 51 y.o. female.   Sore Throat  Patient presents with sore throat.  Has had for the last few days.  Patient's sons have had URI type symptoms with sore throat but patient has not developed cough.  States she has had strep in the past and this felt like this.  No fevers.  Does have some muscle aches.  Some pain with swallowing but no difficulty swallowing.  No drooling.    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 No pertinent surgical history.  Home Medications Prior to Admission medications   Medication Sig Start Date End Date Taking? Authorizing Provider  acetaminophen (TYLENOL) 325 MG tablet Take 650 mg by mouth every 6 (six) hours as needed.    [provider]  aspirin-acetaminophen-caffeine (EXCEDRIN MIGRAINE) [phone] MG tablet Take by mouth every 6 (six) hours as needed for headache.    [provider]      Allergies    Patient has no known allergies.    Review of Systems   Review of Systems  Physical Exam Updated Vital Signs BP 113/73   Pulse (!) 49   Temp 98.7 F (37.1 C)   Resp 14   Ht '5\' 5"'$  (1.651 m)   Wt 56.7 kg   SpO2 100%   BMI 20.80 kg/m  Physical Exam Vitals reviewed.  Constitutional:      Appearance: She is well-developed.  HENT:     Head: Normocephalic.     Mouth/Throat:     Mouth: Mucous membranes are moist.     Comments: Posterior pharyngeal erythema without exudate.  Uvula midline.  No swelling. Cardiovascular:     Rate and Rhythm: Normal rate.  Neurological:     Mental Status: She is alert.     ED Results / Procedures / Treatments   Labs (all labs ordered are listed, but only abnormal results are displayed) Labs Reviewed  GROUP  A STREP BY PCR    EKG None  Radiology No results found.  Procedures Procedures    Medications Ordered in ED Medications  dexamethasone (DECADRON) tablet 8 mg (8 mg Oral Given 02/09/22 1059)    ED Course/ Medical Decision Making/ A&P                           Medical Decision Making Risk Prescription drug management.   Patient with sore throat.  Posterior pharyngeal erythema without exudate.  Differential diagnosis includes strep and other cause of pharyngitis.  Negative strep test here.  No apparent peritonsillar abscess.  Well-appearing.  Discussed with patient about potential COVID testing.  Patient's sons have had URI symptoms at home.  Patient will take a test at home.  Will discharge.  Will give Decadron to help with relief of symptoms.        Final Clinical Impression(s) / ED Diagnoses Final diagnoses:  Pharyngitis, unspecified etiology    Rx / DC Orders ED Discharge Orders     None         Davonna Belling, MD 02/09/22 1119

## 2022-02-09 NOTE — ED Triage Notes (Signed)
Patient reports she has 2 college aged sons who have had cold symptoms and she feels like she has razor blades in her throat. Patient reports she has had strep in the past and this feels similar.

## 2023-06-20 ENCOUNTER — Other Ambulatory Visit: Payer: Self-pay | Admitting: Obstetrics and Gynecology

## 2023-06-20 DIAGNOSIS — R928 Other abnormal and inconclusive findings on diagnostic imaging of breast: Secondary | ICD-10-CM

## 2023-06-24 ENCOUNTER — Ambulatory Visit
Admission: RE | Admit: 2023-06-24 | Discharge: 2023-06-24 | Disposition: A | Discharge status: Home / Self Care | Payer: BC Managed Care – PPO | Source: Ambulatory Visit | Attending: Obstetrics and Gynecology | Admitting: Obstetrics and Gynecology

## 2023-06-24 ENCOUNTER — Other Ambulatory Visit: Payer: Self-pay | Admitting: Obstetrics and Gynecology

## 2023-06-24 DIAGNOSIS — N6320 Unspecified lump in the left breast, unspecified quadrant: Secondary | ICD-10-CM

## 2023-06-24 DIAGNOSIS — R928 Other abnormal and inconclusive findings on diagnostic imaging of breast: Secondary | ICD-10-CM

## 2023-07-15 ENCOUNTER — Ambulatory Visit
Admission: RE | Admit: 2023-07-15 | Discharge: 2023-07-15 | Disposition: A | Discharge status: Home / Self Care | Payer: BC Managed Care – PPO | Source: Ambulatory Visit | Attending: Obstetrics and Gynecology | Admitting: Obstetrics and Gynecology

## 2023-07-15 DIAGNOSIS — N6320 Unspecified lump in the left breast, unspecified quadrant: Secondary | ICD-10-CM

## 2023-07-15 HISTORY — PX: BREAST BIOPSY: SHX20

## 2023-07-18 LAB — SURGICAL PATHOLOGY
